# Patient Record
Sex: Male | Born: 1995 | Race: White | Hispanic: No | Marital: Single
Health system: Southern US, Community
[De-identification: ages and names within clinical notes are randomized; demographics above are authoritative.]

## PROBLEM LIST (undated history)

## (undated) DIAGNOSIS — L709 Acne, unspecified: Secondary | ICD-10-CM

## (undated) HISTORY — DX: Acne, unspecified: L70.9

---
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
## 2017-01-12 DIAGNOSIS — L700 Acne vulgaris: Secondary | ICD-10-CM | POA: Diagnosis not present

## 2017-01-12 DIAGNOSIS — L74512 Primary focal hyperhidrosis, palms: Secondary | ICD-10-CM | POA: Diagnosis not present

## 2017-01-12 DIAGNOSIS — L74510 Primary focal hyperhidrosis, axilla: Secondary | ICD-10-CM | POA: Diagnosis not present

## 2017-10-02 DIAGNOSIS — Z114 Encounter for screening for human immunodeficiency virus [HIV]: Secondary | ICD-10-CM | POA: Diagnosis not present

## 2017-10-02 DIAGNOSIS — Z113 Encounter for screening for infections with a predominantly sexual mode of transmission: Secondary | ICD-10-CM | POA: Diagnosis not present

## 2017-10-06 DIAGNOSIS — Z23 Encounter for immunization: Secondary | ICD-10-CM | POA: Diagnosis not present

## 2017-12-16 DIAGNOSIS — L74512 Primary focal hyperhidrosis, palms: Secondary | ICD-10-CM | POA: Diagnosis not present

## 2017-12-16 DIAGNOSIS — D225 Melanocytic nevi of trunk: Secondary | ICD-10-CM | POA: Diagnosis not present

## 2017-12-16 DIAGNOSIS — D485 Neoplasm of uncertain behavior of skin: Secondary | ICD-10-CM | POA: Diagnosis not present

## 2017-12-16 DIAGNOSIS — Z79899 Other long term (current) drug therapy: Secondary | ICD-10-CM | POA: Diagnosis not present

## 2017-12-16 DIAGNOSIS — L700 Acne vulgaris: Secondary | ICD-10-CM | POA: Diagnosis not present

## 2017-12-16 DIAGNOSIS — D229 Melanocytic nevi, unspecified: Secondary | ICD-10-CM | POA: Diagnosis not present

## 2017-12-16 DIAGNOSIS — L720 Epidermal cyst: Secondary | ICD-10-CM | POA: Diagnosis not present

## 2017-12-16 DIAGNOSIS — L905 Scar conditions and fibrosis of skin: Secondary | ICD-10-CM | POA: Diagnosis not present

## 2017-12-16 DIAGNOSIS — D2239 Melanocytic nevi of other parts of face: Secondary | ICD-10-CM | POA: Diagnosis not present

## 2017-12-16 DIAGNOSIS — L74510 Primary focal hyperhidrosis, axilla: Secondary | ICD-10-CM | POA: Diagnosis not present

## 2018-02-08 DIAGNOSIS — Z79899 Other long term (current) drug therapy: Secondary | ICD-10-CM | POA: Diagnosis not present

## 2018-02-08 DIAGNOSIS — L700 Acne vulgaris: Secondary | ICD-10-CM | POA: Diagnosis not present

## 2018-12-16 DIAGNOSIS — L74510 Primary focal hyperhidrosis, axilla: Secondary | ICD-10-CM | POA: Diagnosis not present

## 2018-12-16 DIAGNOSIS — L700 Acne vulgaris: Secondary | ICD-10-CM | POA: Diagnosis not present

## 2018-12-16 DIAGNOSIS — B078 Other viral warts: Secondary | ICD-10-CM | POA: Diagnosis not present

## 2019-03-18 DIAGNOSIS — L700 Acne vulgaris: Secondary | ICD-10-CM | POA: Diagnosis not present

## 2019-03-18 DIAGNOSIS — B078 Other viral warts: Secondary | ICD-10-CM | POA: Diagnosis not present

## 2020-03-19 ENCOUNTER — Other Ambulatory Visit: Payer: Self-pay

## 2020-03-19 ENCOUNTER — Ambulatory Visit (INDEPENDENT_AMBULATORY_CARE_PROVIDER_SITE_OTHER): Payer: 59 | Admitting: Dermatology

## 2020-03-19 VITALS — Wt 185.0 lb

## 2020-03-19 DIAGNOSIS — L700 Acne vulgaris: Secondary | ICD-10-CM

## 2020-03-19 MED ORDER — ISOTRETINOIN 40 MG PO CAPS: 40.0000 mg | ORAL_CAPSULE | Freq: Every day | ORAL | 0 refills | Status: AC

## 2020-03-19 NOTE — Progress Notes (Signed)
   Follow-Up Visit   Subjective  Frederick Anderson is a 24 y.o. male who presents for the following: Acne (wk 12 Isotretinoin, Isotretinoin 30mg  1 po qd).  No side effects from med other than mildly chapped lips.  No depression or mood changes.  No acne breakouts this month.  Virtual visit today.   The following portions of the chart were reviewed this encounter and updated as appropriate:     Review of Systems: No other skin or systemic complaints.  Objective  Well appearing patient in no apparent distress; mood and affect are within normal limits.  A focused examination was performed including face. Relevant physical exam findings are noted in the Assessment and Plan.   Objective  face: Face clear today  Assessment & Plan  Acne vulgaris face  Wk 12 Isotretinoin  Increase to Isotretinoin 40mg  1 po qd with fatty meal.  While taking isotretinoin, do not share pills and do not donate blood. Isotretinoin is best absorbed when taken with a fatty meal. Isotretinoin can make you sensitive to the sun. Daily careful sun protection including sunscreen SPF 30+ when outdoors is recommended.   Pt confirmed in [place] program.   ISOtretinoin (ACCUTANE) 40 MG capsule - face  Return in about 1 month (around 04/19/2020) for Isotretinoin.   I, Othelia Pulling, RMA, am acting as scribe for [name], MD .

## 2020-04-23 ENCOUNTER — Telehealth (INDEPENDENT_AMBULATORY_CARE_PROVIDER_SITE_OTHER): Payer: 59 | Admitting: Dermatology

## 2020-04-23 ENCOUNTER — Other Ambulatory Visit: Payer: Self-pay

## 2020-04-23 VITALS — Wt 185.0 lb

## 2020-04-23 DIAGNOSIS — L700 Acne vulgaris: Secondary | ICD-10-CM

## 2020-04-23 DIAGNOSIS — K130 Diseases of lips: Secondary | ICD-10-CM | POA: Diagnosis not present

## 2020-04-23 DIAGNOSIS — L853 Xerosis cutis: Secondary | ICD-10-CM

## 2020-04-23 MED ORDER — ISOTRETINOIN 40 MG PO CAPS: 40.0000 mg | ORAL_CAPSULE | Freq: Every day | ORAL | 0 refills | Status: AC

## 2020-04-23 NOTE — Progress Notes (Signed)
   Isotretinoin Follow-Up Visit   Subjective  Frederick Anderson is a 24 y.o. male who presents for the following: Telehealth Consent and Acne (wk #16 isotretinoin 40mg  1 po QD). No side effects from medicine other than mildly chapped lips.  No depression or mood changes.  No acne breakouts this month.  MyChart Video visit today.  He started higher dose of 40 mg about 10 days ago.  Subjective  Frederick Anderson is a 24 y.o. male who presents for the following: Telehealth Consent and Acne (wk #16 isotretinoin 40mg  1 po QD).  Week # 16  Isotretinoin F/U - 04/23/20 1700      Isotretinoin Follow Up   iPledge #  [id number]    Date  04/23/20    Weight  185 lb (83.9 kg)    Acne breakouts since last visit?  No      Dosage   Current (To Date) Dosage (mg)  40mg       Side Effects   Skin  Chapped Lips;Dry Skin    Gastrointestinal  WNL    Neurological  WNL    Constitutional  WNL       Side effects: Dry skin, dry lips  Denies changes in night vision, shortness of breath, abdominal pain, nausea, vomiting, diarrhea, blood in stool or urine, visual changes, headaches, epistaxis, joint pain, myalgias, mood changes, depression, or suicidal ideation.   The following portions of the chart were reviewed this encounter and updated as appropriate: medications, allergies, medical history  Review of Systems:  No other skin or systemic complaints except as noted in HPI or Assessment and Plan.  Objective  Well appearing patient in no apparent distress; mood and affect are within normal limits.  An examination of the face, neck, chest, and back was performed and relevant findings are noted below.   Objective  Face: Face clear today.   Assessment & Plan   Acne vulgaris Face  Improving Week #16 isotretinoin Video visit today  Continue isotretinoin 40mg  take 1 po QD with a fatty meal dsp #30 0Rf.  Patient confirmed in [place] program.  While taking isotretinoin, do not share pills and do not  donate blood. Isotretinoin is best absorbed when taken with a fatty meal. Isotretinoin can make you sensitive to the sun. Daily careful sun protection including sunscreen SPF 30+ when outdoors is recommended.   ISOtretinoin (ACCUTANE) 40 MG capsule - Face   Xerosis - Continue emollients as directed  Cheilitis - Continue lip balm as directed, Dr. Luvenia Heller Cortibalm recommended  Follow-up in 30 days.  IJamesetta Orleans, CMA, am acting as scribe for [name], MD .    Documentation: I have reviewed the above documentation for accuracy and completeness, and I agree with the above.  [name], MD

## 2020-06-05 ENCOUNTER — Other Ambulatory Visit: Payer: Self-pay

## 2020-06-05 ENCOUNTER — Telehealth (INDEPENDENT_AMBULATORY_CARE_PROVIDER_SITE_OTHER): Payer: 59 | Admitting: Dermatology

## 2020-06-05 ENCOUNTER — Encounter: Payer: Self-pay | Admitting: Dermatology

## 2020-06-05 VITALS — Wt 185.0 lb

## 2020-06-05 DIAGNOSIS — L700 Acne vulgaris: Secondary | ICD-10-CM | POA: Diagnosis not present

## 2020-06-05 DIAGNOSIS — Z79899 Other long term (current) drug therapy: Secondary | ICD-10-CM

## 2020-06-05 DIAGNOSIS — L853 Xerosis cutis: Secondary | ICD-10-CM | POA: Diagnosis not present

## 2020-06-05 DIAGNOSIS — K130 Diseases of lips: Secondary | ICD-10-CM

## 2020-06-05 MED ORDER — ISOTRETINOIN 40 MG PO CAPS: 40.0000 mg | ORAL_CAPSULE | Freq: Every day | ORAL | 0 refills | Status: AC

## 2020-06-05 NOTE — Progress Notes (Addendum)
CC Acne and isotretinoin follow-up  HPI isotretinoin  Subjective  Frederick Anderson is a 24 y.o. male who presents for the following: Acne.  Week # 20 40mg  1po QD  Isotretinoin F/U - 06/05/20 1700      Isotretinoin Follow Up   iPledge # [id number]    Date 06/05/20    Weight 185 lb (83.9 kg)    Acne breakouts since last visit? No      Dosage   Current (To Date) Dosage (mg) 40mg       Side Effects   Skin Dry Eyes;Chapped Lips;Dry Skin   Dry Eyes  new symptom   Gastrointestinal WNL    Neurological WNL    Constitutional WNL              Side effects: Dry skin, dry lips  Denies changes in night vision, shortness of breath, abdominal pain, nausea, vomiting, diarrhea, blood in stool or urine, visual changes, headaches, epistaxis, joint pain, myalgias, mood changes, depression, or suicidal ideation.   Objective Exam of face, chest and back performed and relevant findings noted in Assessment and Plan  A/P:  Acne vulgaris -  Week #20 isotretinoin - Exam shows Face Clear, improving - Continue Isotretinoin at 40 mg daily - Labs none  While taking Isotretinoin and for 30 days after you finish the medication,do not share pills, do not donate blood. Isotretinoin is best absorbed when taken with a fatty meal. Isotretinoin can make you sensitive to the sun. Daily careful sun protection including sunscreen SPF 30+ when outdoors is recommended.  Xerosis - Continue emollients as directed  Cheilitis - Continue lip balm as directed, Dr. Luvenia Heller Cortibalm recommended  Follow-up in 30 days  Virtual visit today Patient located in [place] located in [place] Patient voices understanding their insurance will be billed just like for a regular in office visit  Patient confirmed in iPledge and isotretinoin sent to pharmacy.   Visit was done by video- Virtual visit duration 12 minutes  I, Donzetta Kohut, CMA, am acting as scribe for [name], MD  .  Documentation: I have reviewed the above documentation for accuracy and completeness, and I agree with the above.  [name] MD

## 2020-09-16 ENCOUNTER — Telehealth (INDEPENDENT_AMBULATORY_CARE_PROVIDER_SITE_OTHER): Payer: Self-pay | Admitting: Dermatology

## 2020-09-16 ENCOUNTER — Other Ambulatory Visit: Payer: Self-pay

## 2020-09-16 VITALS — Wt 185.0 lb

## 2020-09-16 DIAGNOSIS — L700 Acne vulgaris: Secondary | ICD-10-CM

## 2020-09-16 DIAGNOSIS — Z79899 Other long term (current) drug therapy: Secondary | ICD-10-CM

## 2020-09-16 DIAGNOSIS — L853 Xerosis cutis: Secondary | ICD-10-CM

## 2020-09-16 DIAGNOSIS — K130 Diseases of lips: Secondary | ICD-10-CM

## 2020-09-16 MED ORDER — ISOTRETINOIN 40 MG PO CAPS: 40.0000 mg | ORAL_CAPSULE | Freq: Every day | ORAL | 0 refills | Status: AC

## 2020-09-16 NOTE — Progress Notes (Signed)
Isotretinoin Follow-Up Visit   Subjective  Frederick Anderson is a 24 y.o. male who presents for the following: Acne (Wk #24). Patient ran out of medicine 2 weeks ago. He was taking isotretinoin 40mg  qd with only dry lips. No other side effects.  Week # 24 [place]   Isotretinoin F/U - 09/16/20 1700      Isotretinoin Follow Up   iPledge # [id number]    Date 09/16/20    Weight 185 lb (83.9 kg)    Acne breakouts since last visit? Yes      Dosage   Current (To Date) Dosage (mg) 40mg       Side Effects   Skin Chapped Lips    Gastrointestinal WNL    Neurological WNL    Constitutional WNL             Side effects: Dry skin, dry lips  Denies changes in night vision, shortness of breath, abdominal pain, nausea, vomiting, diarrhea, blood in stool or urine, visual changes, headaches, epistaxis, joint pain, myalgias, mood changes, depression, or suicidal ideation.   The following portions of the chart were reviewed this encounter and updated as appropriate: medications, allergies, medical history  Review of Systems:  No other skin or systemic complaints except as noted in HPI or Assessment and Plan.  Objective  Well appearing patient in no apparent distress; mood and affect are within normal limits.  An examination of the face, neck, chest, and back was performed and relevant findings are noted below.   Objective  Face: Clear today.   Assessment & Plan   Acne vulgaris Face  Wk #24 [place]  Improved.   Continue isotretinoin 40mg  1 po qd with a fatty meal dsp #30 0Rf.  Patient confirmed in iPledge and isotretinoin sent to pharmacy.    Xerosis - Continue emollients as directed  Cheilitis - Continue lip balm as directed, Dr. Luvenia Heller Cortibalm recommended  Long term medication management (isotretinoin) - While taking Isotretinoin and for 30 days after you finish the medication, do not share pills, do not donate blood. Isotretinoin is best absorbed  when taken with a fatty meal. Isotretinoin can make you sensitive to the sun. Daily careful sun protection including sunscreen SPF 30+ when outdoors is recommended.  Follow-up in 30 days.  Virtual Visit via Video Note  I connected with Frederick Anderson on 09/16/20 at  4:00 PM EDT by a video enabled telemedicine application and verified that I am speaking with the correct person using two identifiers.  Location: Patient: Frederick Anderson Provider:[facility] [place]   I discussed the limitations of evaluation and management by telemedicine and the availability of in person appointments. The patient expressed understanding and agreed to proceed.  I discussed the assessment and treatment plan with the patient. The patient was provided an opportunity to ask questions and all were answered. The patient agreed with the plan and demonstrated an understanding of the instructions.   The patient was advised to call back or seek an in-person evaluation if the symptoms worsen or if the condition fails to improve as anticipated.  Virtual visit today Patient located in [place] located in [place] Patient voices understanding their insurance will be billed just like for a regular in office visit  MyChart Video Visit today - total 13 mins.  [name], CMA, am acting as scribe for [name], MD .   Documentation: I have reviewed the above documentation for accuracy and completeness, and I agree with the  above.  [name] MD

## 2021-01-28 ENCOUNTER — Encounter: Payer: Managed Care, Other (non HMO) | Admitting: Dermatology

## 2021-01-28 ENCOUNTER — Other Ambulatory Visit: Payer: Self-pay

## 2021-01-28 NOTE — Progress Notes (Signed)
e  Follow-Up Visit     .virt  The following portions of the chart were reviewed this encounter and updated as appropriate:       Review of Systems:  No other skin or systemic complaints except as noted in HPI or Assessment and Plan.  Objective  Well appearing patient in no apparent distress; mood and affect are within normal limits.      Assessment & Plan   No follow-ups on file. This encounter was created in error - please disregard.

## 2021-01-29 ENCOUNTER — Telehealth (INDEPENDENT_AMBULATORY_CARE_PROVIDER_SITE_OTHER): Payer: Managed Care, Other (non HMO) | Admitting: Dermatology

## 2021-01-29 DIAGNOSIS — K130 Diseases of lips: Secondary | ICD-10-CM

## 2021-01-29 DIAGNOSIS — L853 Xerosis cutis: Secondary | ICD-10-CM

## 2021-01-29 DIAGNOSIS — L74519 Primary focal hyperhidrosis, unspecified: Secondary | ICD-10-CM

## 2021-01-29 DIAGNOSIS — L74510 Primary focal hyperhidrosis, axilla: Secondary | ICD-10-CM | POA: Diagnosis not present

## 2021-01-29 DIAGNOSIS — Z79899 Other long term (current) drug therapy: Secondary | ICD-10-CM | POA: Diagnosis not present

## 2021-01-29 DIAGNOSIS — L700 Acne vulgaris: Secondary | ICD-10-CM

## 2021-01-29 MED ORDER — QBREXZA 2.4 % EX PADS: MEDICATED_PAD | CUTANEOUS | 5 refills | Status: DC

## 2021-01-29 MED ORDER — ISOTRETINOIN 40 MG PO CAPS: 40.0000 mg | ORAL_CAPSULE | Freq: Every day | ORAL | 0 refills | Status: DC

## 2021-01-29 NOTE — Progress Notes (Signed)
Video Visit   Subjective  Frederick Anderson is a 25 y.o. male who presents for the following: Acne (Patient following up on acne s/p isotretinoin. He finished isotretinoin about 1 week ago.) and Excessive Sweating (Patient complains of sweating of the hands, axilla, and feet for years. He has used topicals in the past but unsure of the names. He has also has Botox injections in the past. He is currently using topical Carpe on the hands with minimal improvement.). Pt is on low dose isotretinoin since unable to tolerate s.e. at higher dosing.   Isotretinoin F/U - 01/29/21 1500      Isotretinoin Follow Up   iPledge # [id number]    Date 01/29/21    Acne breakouts since last visit? No      Dosage   Current (To Date) Dosage (mg) 40 MG      Side Effects   Skin WNL    Gastrointestinal WNL    Neurological WNL    Constitutional WNL            The following portions of the chart were reviewed this encounter and updated as appropriate:       Review of Systems:  No other skin or systemic complaints except as noted in HPI or Assessment and Plan.  Objective  Well appearing patient in no apparent distress; mood and affect are within normal limits.  A focused examination was performed including face. Relevant physical exam findings are noted in the Assessment and Plan.  Objective  face: Face clear today.  Objective  hands, feet, axilla: Excessive sweating per patient.   Assessment & Plan  Acne vulgaris face  Wk #28. Improving.  Continue Absorica 40mg  take 1 po QD dsp #30 0Rf  Patient confirmed in [facility] program and medicine sent in.     ISOtretinoin (ABSORICA) 40 MG capsule - face  Primary focal hyperhidrosis hands, feet, axilla  Start Qbrexza Swipe cloth under each arm once daily, then rub cloth on hands x 3 mins until cloth is dry. Apply cotton gloves and leave on for 30 mins, then wash thoroughly. Dsp #30 5Rf.  May try on feet. Avoid touching eye  area.   Glycopyrronium Tosylate (QBREXZA) 2.4 % PADS - hands, feet, axilla  Xerosis - Continue emollients as directed  Cheilitis - Continue lip balm as directed, Dr. Luvenia Heller Cortibalm recommended  Long term medication management (isotretinoin) - While taking Isotretinoin and for 30 days after you finish the medication, do not share pills, do not donate blood. Isotretinoin is best absorbed when taken with a fatty meal. Isotretinoin can make you sensitive to the sun. Daily careful sun protection including sunscreen SPF 30+ when outdoors is recommended.  Virtual Visit via Video Note   I connected with Frederick Anderson on January 29, 2021 at  1:20PM  EDT by a video enabled telemedicine application and verified that I am speaking with the correct person using two identifiers.   Location: Patient: [place], [place]  Provider: [place], [place] discussed the limitations of evaluation and management by telemedicine and the availability of in person appointments. The patient expressed understanding and agreed to proceed.   I discussed the assessment and treatment plan with the patient. The patient was provided an opportunity to ask questions and all were answered. The patient agreed with the plan and demonstrated an understanding of the instructions.   The patient was advised to call back or seek an in-person evaluation if the symptoms worsen or if the  condition fails to improve as anticipated.   Patient located in [place], [place] at time of visit.  Patient is aware that insurance will be billed for this appointment.  [place] is located in [place], [place]  Length of Video Visit: 11 minutes  Return in about 30 days (around 02/28/2021) for Absorica f/u, Hyperhidrosis.   IJamesetta Anderson, CMA, am acting as scribe for Frederick Patty, MD .  Documentation: I have reviewed the above documentation for accuracy and completeness, and I agree with the above.  Frederick Patty MD

## 2021-03-19 ENCOUNTER — Ambulatory Visit (INDEPENDENT_AMBULATORY_CARE_PROVIDER_SITE_OTHER): Payer: Managed Care, Other (non HMO) | Admitting: Dermatology

## 2021-03-19 ENCOUNTER — Other Ambulatory Visit: Payer: Self-pay

## 2021-03-19 ENCOUNTER — Encounter: Payer: Self-pay | Admitting: Dermatology

## 2021-03-19 VITALS — Wt 195.0 lb

## 2021-03-19 DIAGNOSIS — D225 Melanocytic nevi of trunk: Secondary | ICD-10-CM | POA: Diagnosis not present

## 2021-03-19 DIAGNOSIS — L700 Acne vulgaris: Secondary | ICD-10-CM

## 2021-03-19 DIAGNOSIS — D2239 Melanocytic nevi of other parts of face: Secondary | ICD-10-CM

## 2021-03-19 DIAGNOSIS — D224 Melanocytic nevi of scalp and neck: Secondary | ICD-10-CM | POA: Diagnosis not present

## 2021-03-19 DIAGNOSIS — D492 Neoplasm of unspecified behavior of bone, soft tissue, and skin: Secondary | ICD-10-CM

## 2021-03-19 MED ORDER — ISOTRETINOIN 40 MG PO CAPS: 40.0000 mg | ORAL_CAPSULE | Freq: Every day | ORAL | 0 refills | Status: DC

## 2021-03-19 NOTE — Progress Notes (Signed)
Follow-Up Visit   Subjective  Frederick Anderson is a 25 y.o. male who presents for the following: Nevus (Left side of neck. Dur: >1 year. Dark in color. ). Acne. Accutane therapy. End of 32nd week. Tolerating well. Taking 40 mg daily.  Has other irritated moles to remove.   Isotretinoin F/U - 03/19/21 1300      Isotretinoin Follow Up   iPledge # [id number]    Date 03/19/21    Weight 195 lb (88.5 kg)    Acne breakouts since last visit? Yes      Dosage   Target Dosage (mg) 17, 700 mg    Current (To Date) Dosage (mg) 8700 mg    To Go Dosage (mg) 9,000 mg      Side Effects   Skin WNL;Chapped Lips;Dry Lips    Gastrointestinal WNL    Neurological WNL    Constitutional WNL            The following portions of the chart were reviewed this encounter and updated as appropriate:      Review of Systems: No other skin or systemic complaints except as noted in HPI or Assessment and Plan.  Objective  Well appearing patient in no apparent distress; mood and affect are within normal limits.  All skin waist up examined.  Objective  Left lateral Neck: 0.2cm dark brown macule     Objective  Left proximal angle of  Mandible: 0.2cm brown papule  Objective  Right superior medial scapula: 0.3cm brown papule     Objective  face: Face clear   Assessment & Plan  Neoplasm of skin (3) Left lateral Neck  Epidermal / dermal shaving  Lesion diameter (cm):  0.4 Informed consent: discussed and consent obtained   Patient was prepped and draped in usual sterile fashion: Area prepped with alcohol. Anesthesia: the lesion was anesthetized in a standard fashion   Anesthetic:  1% lidocaine w/ epinephrine 1-100,000 buffered w/ 8.4% NaHCO3 Instrument used: flexible razor blade   Hemostasis achieved with: pressure, aluminum chloride and electrodesiccation   Outcome: patient tolerated procedure well   Post-procedure details: wound care instructions given   Post-procedure details  comment:  Ointment and small bandage applied  Specimen 1 - Surgical pathology Differential Diagnosis: blue nevus vs other  Check Margins: No 0.2cm dark brown macule  Left proximal angle of  Mandible  Epidermal / dermal shaving  Lesion diameter (cm):  0.4 Informed consent: discussed and consent obtained   Patient was prepped and draped in usual sterile fashion: Area prepped with alcohol. Anesthesia: the lesion was anesthetized in a standard fashion   Anesthetic:  1% lidocaine w/ epinephrine 1-100,000 buffered w/ 8.4% NaHCO3 Instrument used: flexible razor blade   Hemostasis achieved with: pressure, aluminum chloride and electrodesiccation   Outcome: patient tolerated procedure well   Post-procedure details: wound care instructions given   Post-procedure details comment:  Ointment and small bandage applied  Specimen 2 - Surgical pathology Differential Diagnosis: irritated nevus vs other  Check Margins: No 0.2cm brown papule  Right superior medial scapula  Epidermal / dermal shaving  Lesion diameter (cm):  0.5 Informed consent: discussed and consent obtained   Patient was prepped and draped in usual sterile fashion: Area prepped with alcohol. Anesthesia: the lesion was anesthetized in a standard fashion   Anesthetic:  1% lidocaine w/ epinephrine 1-100,000 buffered w/ 8.4% NaHCO3 Instrument used: flexible razor blade   Hemostasis achieved with: pressure, aluminum chloride and electrodesiccation   Outcome: patient tolerated procedure  well   Post-procedure details: wound care instructions given   Post-procedure details comment:  Ointment and small bandage applied  Specimen 3 - Surgical pathology Differential Diagnosis:irritated nevus vs other  Check Margins: No 0.3cm brown papule  Acne vulgaris face  End of week 32 isotretinoin Current dosage 8700mg . 98mg /kg  Continue Absorica 40mg  1 po qd with food.  Acne is chronic, severe, not at goal.  While taking  isotretinoin, do not share pills and do not donate blood. Isotretinoin is best absorbed when taken with a fatty meal. Isotretinoin can make you sensitive to the sun. Daily careful sun protection including sunscreen SPF 30+ when outdoors is recommended.   Patient confirmed in iPledge and isotretinoin sent to pharmacy.   Reordered Medications ISOtretinoin (ABSORICA) 40 MG capsule  Return if symptoms worsen or fail to improve, for Patient will call to schedule appointment.   I, Emelia Salisbury, CMA, am acting as scribe for [name], MD.  Documentation: I have reviewed the above documentation for accuracy and completeness, and I agree with the above.  [name] MD

## 2021-03-19 NOTE — Patient Instructions (Addendum)
Wound Care Instructions  1. Cleanse wound gently with soap and water once a day then pat dry with clean gauze. Apply a thing coat of Petrolatum (petroleum jelly, "Vaseline") over the wound (unless you have an allergy to this). We recommend that you use a new, sterile tube of Vaseline. Do not pick or remove scabs. Do not remove the yellow or white "healing tissue" from the base of the wound.  2. Cover the wound with fresh, clean, nonstick gauze and secure with paper tape. You may use Band-Aids in place of gauze and tape if the would is small enough, but would recommend trimming much of the tape off as there is often too much. Sometimes Band-Aids can irritate the skin.  3. You should call the office for your biopsy report after 1 week if you have not already been contacted.  4. If you experience any problems, such as abnormal amounts of bleeding, swelling, significant bruising, significant pain, or evidence of infection, please call the office immediately.  5. FOR ADULT SURGERY PATIENTS: If you need something for pain relief you may take 1 extra strength Tylenol (acetaminophen) AND 2 Ibuprofen (200mg  each) together every 4 hours as needed for pain. (do not take these if you are allergic to them or if you have a reason you should not take them.) Typically, you may only need pain medication for 1 to 3 days.    While taking isotretinoin, do not share pills and do not donate blood. Isotretinoin is best absorbed when taken with a fatty meal. Isotretinoin can make you sensitive to the sun. Daily careful sun protection including sunscreen SPF 30+ when outdoors is recommended.  If you have any questions or concerns for your doctor, please call our main line at [phone] and press option 4 to reach your doctor's medical assistant. If no one answers, please leave a voicemail as directed and we will return your call as soon as possible. Messages left after 4 pm will be answered the following business day.    You may also send [place] a message via [place]. We typically respond to MyChart messages within 1-2 business days.  For prescription refills, please ask your pharmacy to contact our office. Our fax number is [phone].  If you have an urgent issue when the clinic is closed that cannot wait until the next business day, you can page your doctor at the number below.    Please note that while we do our best to be available for urgent issues outside of office hours, we are not available 24/7.   If you have an urgent issue and are unable to reach [place], you may choose to seek medical care at your doctor's office, retail clinic, urgent care center, or emergency room.  If you have a medical emergency, please immediately call 911 or go to the emergency department.  Pager Numbers  - Dr. Nehemiah Massed: [phone]  - Dr. Laurence Ferrari: [phone]  - Dr. Nicole Kindred: [phone]  In the event of inclement weather, please call our main line at [phone] for an update on the status of any delays or closures.  Dermatology Medication Tips: Please keep the boxes that topical medications come in in order to help keep track of the instructions about where and how to use these. Pharmacies typically print the medication instructions only on the boxes and not directly on the medication tubes.   If your medication is too expensive, please contact our office at [phone] option 4 or send [place] a message through [place].  We are unable to tell what your co-pay for medications will be in advance as this is different depending on your insurance coverage. However, we may be able to find a substitute medication at lower cost or fill out paperwork to get insurance to cover a needed medication.   If a prior authorization is required to get your medication covered by your insurance company, please allow [place] 1-2 business days to complete this process.  Drug prices often vary depending on where the prescription is filled and some  pharmacies may offer cheaper prices.  The website www.goodrx.com contains coupons for medications through different pharmacies. The prices here do not account for what the cost may be with help from insurance (it may be cheaper with your insurance), but the website can give you the price if you did not use any insurance.  - You can print the associated coupon and take it with your prescription to the pharmacy.  - You may also stop by our office during regular business hours and pick up a GoodRx coupon card.  - If you need your prescription sent electronically to a different pharmacy, notify our office through [facility] or by phone at [phone] option 4.

## 2021-03-24 ENCOUNTER — Telehealth: Payer: Self-pay

## 2021-03-24 NOTE — Telephone Encounter (Signed)
Left pt msg to call for bx results/sh 

## 2021-03-24 NOTE — Telephone Encounter (Signed)
-----   Message from [name], MD sent at 03/24/2021  9:00 AM EDT ----- 1. Skin , left laterak neck BLUE NEVUS, CLOSE TO MARGIN 2. Skin , left proximal angle of mandible MELANOCYTIC NEVUS, COMPOUND TYPE, IRRITATED, BASE INVOLVED 3. Skin , right upper superior medial scapula MELANOCYTIC NEVUS, INTRADERMAL TYPE, BASE INVOLVED  1. Benign mole (blue nevus) 2. Benign irritated mole 3. Benign mole

## 2021-04-01 ENCOUNTER — Telehealth: Payer: Self-pay

## 2021-04-01 NOTE — Telephone Encounter (Signed)
-----   Message from [name], MD sent at 03/24/2021  9:00 AM EDT ----- 1. Skin , left laterak neck BLUE NEVUS, CLOSE TO MARGIN 2. Skin , left proximal angle of mandible MELANOCYTIC NEVUS, COMPOUND TYPE, IRRITATED, BASE INVOLVED 3. Skin , right upper superior medial scapula MELANOCYTIC NEVUS, INTRADERMAL TYPE, BASE INVOLVED  1. Benign mole (blue nevus) 2. Benign irritated mole 3. Benign mole

## 2021-04-01 NOTE — Telephone Encounter (Signed)
Advised pt of bx results/sh ?

## 2021-12-16 ENCOUNTER — Ambulatory Visit (INDEPENDENT_AMBULATORY_CARE_PROVIDER_SITE_OTHER): Payer: BC Managed Care – PPO | Admitting: Dermatology

## 2021-12-16 ENCOUNTER — Other Ambulatory Visit: Payer: Self-pay

## 2021-12-16 DIAGNOSIS — L649 Androgenic alopecia, unspecified: Secondary | ICD-10-CM | POA: Diagnosis not present

## 2021-12-16 DIAGNOSIS — L74519 Primary focal hyperhidrosis, unspecified: Secondary | ICD-10-CM

## 2021-12-16 DIAGNOSIS — L700 Acne vulgaris: Secondary | ICD-10-CM

## 2021-12-16 DIAGNOSIS — L74510 Primary focal hyperhidrosis, axilla: Secondary | ICD-10-CM

## 2021-12-16 MED ORDER — ISOTRETINOIN 20 MG PO CAPS: 20.0000 mg | ORAL_CAPSULE | Freq: Two times a day (BID) | ORAL | 0 refills | Status: DC

## 2021-12-16 MED ORDER — GLYCOPYRROLATE 1 MG PO TABS: 1.0000 mg | ORAL_TABLET | Freq: Two times a day (BID) | ORAL | 1 refills | Status: DC

## 2021-12-16 MED ORDER — FINASTERIDE 1 MG PO TABS: 1.0000 mg | ORAL_TABLET | Freq: Every day | ORAL | 1 refills | Status: DC

## 2021-12-16 MED ORDER — MINOXIDIL 2.5 MG PO TABS: 2.5000 mg | ORAL_TABLET | Freq: Two times a day (BID) | ORAL | 1 refills | Status: DC

## 2021-12-16 NOTE — Patient Instructions (Addendum)
Start minoxidil 2.5 mg once daily and if tolerating, may increase to twice daily.  Continue finasteride 1 mg once daily.  Continue glycopyrrolate 1 mg twice daily as needed for sweating. Avoid taking prior to exercise or when out in the heat.   Restart isotretinoin 20 mg twice daily    If You Need Anything After Your Visit  If you have any questions or concerns for your doctor, please call our main line at [phone] and press option 4 to reach your doctor's medical assistant. If no one answers, please leave a voicemail as directed and we will return your call as soon as possible. Messages left after 4 pm will be answered the following business day.   You may also send [place] a message via [place]. We typically respond to MyChart messages within 1-2 business days.  For prescription refills, please ask your pharmacy to contact our office. Our fax number is [phone].  If you have an urgent issue when the clinic is closed that cannot wait until the next business day, you can page your doctor at the number below.    Please note that while we do our best to be available for urgent issues outside of office hours, we are not available 24/7.   If you have an urgent issue and are unable to reach [place], you may choose to seek medical care at your doctor's office, retail clinic, urgent care center, or emergency room.  If you have a medical emergency, please immediately call 911 or go to the emergency department.  Pager Numbers  - Dr. Nehemiah Massed: [phone]  - Dr. Laurence Ferrari: [phone]  - Dr. Nicole Kindred: [phone]  In the event of inclement weather, please call our main line at [phone] for an update on the status of any delays or closures.  Dermatology Medication Tips: Please keep the boxes that topical medications come in in order to help keep track of the instructions about where and how to use these. Pharmacies typically print the medication instructions only on the boxes and not directly  on the medication tubes.   If your medication is too expensive, please contact our office at [phone] option 4 or send [place] a message through [place].   We are unable to tell what your co-pay for medications will be in advance as this is different depending on your insurance coverage. However, we may be able to find a substitute medication at lower cost or fill out paperwork to get insurance to cover a needed medication.   If a prior authorization is required to get your medication covered by your insurance company, please allow [place] 1-2 business days to complete this process.  Drug prices often vary depending on where the prescription is filled and some pharmacies may offer cheaper prices.  The website www.goodrx.com contains coupons for medications through different pharmacies. The prices here do not account for what the cost may be with help from insurance (it may be cheaper with your insurance), but the website can give you the price if you did not use any insurance.  - You can print the associated coupon and take it with your prescription to the pharmacy.  - You may also stop by our office during regular business hours and pick up a GoodRx coupon card.  - If you need your prescription sent electronically to a different pharmacy, notify our office through [facility] or by phone at [phone] option 4.     Si Usted Necesita Algo Despus de Su Visita  Tambin  puede enviarnos un mensaje a travs de MyChart. Por lo general respondemos a los mensajes de MyChart en el transcurso de 1 a 2 das hbiles.  Para renovar recetas, por favor pida a su farmacia que se ponga en contacto con nuestra oficina. Harland Dingwall de fax es [place] [phone].  Si tiene un asunto urgente cuando la clnica est cerrada y que no puede esperar hasta el siguiente da hbil, puede llamar/localizar a su doctor(a) al nmero que aparece a continuacin.   Por favor, tenga en cuenta que aunque hacemos todo lo  posible para estar disponibles para asuntos urgentes fuera del horario de [place], no estamos disponibles las 24 horas del da, los 7 das de la [place].   Si tiene un problema urgente y no puede comunicarse con nosotros, puede optar por buscar atencin mdica  en el consultorio de su doctor(a), en una clnica privada, en un centro de atencin urgente o en una sala de emergencias.  Si tiene Engineering geologist, por favor llame inmediatamente al 911 o vaya a la sala de emergencias.  Nmeros de bper  - Dr. Nehemiah Massed: [phone]  - Dra. Moye: [phone]  - Dra. Nicole Kindred: [phone]  En caso de inclemencias del [place], por favor llame a Johnsie Kindred principal al [phone] para una actualizacin sobre el [place] de cualquier retraso o cierre.  Consejos para la medicacin en dermatologa: Por favor, guarde las cajas en las que vienen los medicamentos de uso tpico para ayudarle a seguir las instrucciones sobre dnde y cmo usarlos. Las farmacias generalmente imprimen las instrucciones del medicamento slo en las cajas y no directamente en los tubos del [place].   Si su medicamento es muy caro, por favor, pngase en contacto con Zigmund Daniel llamando al [phone] y presione la opcin 4 o envenos un mensaje a travs de Pharmacist, community.   No podemos decirle cul ser su copago por los medicamentos por adelantado ya que esto es diferente dependiendo de la cobertura de su seguro. Sin embargo, es posible que podamos encontrar un medicamento sustituto a Electrical engineer un formulario para que el seguro cubra el medicamento que se considera necesario.   Si se requiere una autorizacin previa para que su compaa de seguros [place] su medicamento, por favor permtanos de 1 a 2 das hbiles para completar este proceso.  Los precios de los medicamentos varan con frecuencia dependiendo del Environmental consultant de dnde se surte la receta y alguna farmacias pueden ofrecer precios ms baratos.  El sitio web  www.goodrx.com tiene cupones para medicamentos de Airline pilot. Los precios aqu no tienen en cuenta lo que podra costar con la ayuda del seguro (puede ser ms barato con su seguro), pero el sitio web puede darle el precio si no utiliz Research scientist (physical sciences).  - Puede imprimir el cupn correspondiente y llevarlo con su receta a la farmacia.  - Tambin puede pasar por nuestra oficina durante el horario de atencin regular y Charity fundraiser una tarjeta de cupones de GoodRx.  - Si necesita que su receta se enve electrnicamente a una farmacia diferente, informe a nuestra oficina a travs de MyChart de [place] o por telfono llamando al [phone] y presione la opcin 4.

## 2021-12-16 NOTE — Progress Notes (Signed)
Follow-Up Visit   Subjective  Frederick Anderson is a 25 y.o. male who presents for the following: Excessive Sweating (Patient currently on 1 mg glycopyrrolate and is pleased with how it is working. ) and Acne (Patient would like to talk about accutane. He has been off of medication for about 2 weeks and has been taking sporadically. He would like to continue on medication. ).  He has had a few breakouts since stopping.  He sometimes needs to increase the glycopyrrolate to twice daily.  Patient has been on finasteride but would like to talk about taking oral minoxidil. Patient has used topical minoxidil, finasteride 1 mg (needs rfs)   The following portions of the chart were reviewed this encounter and updated as appropriate:       Review of Systems:  No other skin or systemic complaints except as noted in HPI or Assessment and Plan.  Objective  Well appearing patient in no apparent distress; mood and affect are within normal limits.  A focused examination was performed including face, scalp. Relevant physical exam findings are noted in the Assessment and Plan.  bilateral hands, axilla Excessive sweating   Scalp Mild bitemporal recession and thinning on crown scalp  face Few closed comedones at cheeks    Assessment & Plan  Primary focal hyperhidrosis bilateral hands, axilla  Continue glycopyrrolate 1 mg twice daily as needed for sweating. Avoid taking prior to exercise or when out in the heat.   Glycopyrrolate can significantly increase the risk of heat stroke so you should avoid using it in the heat, particularly while active. It can also cause dry mouth, blurred vision, difficulty with urination, headache, constipation, and racing heart. Take it only as directed. Never take more than 8 tablets total per day.   glycopyrrolate (ROBINUL) 1 MG tablet - bilateral hands, axilla Take 1 tablet (1 mg total) by mouth 2 (two) times daily.  Androgenic alopecia Scalp  Start  minoxidil 2.5 mg once daily and if tolerating, may increase to twice daily. S.e. dizziness, lightheadedness, low blood pressure Continue finasteride 1 mg once daily, tolerating well, no side effects   BP 129/76   minoxidil (LONITEN) 2.5 MG tablet - Scalp Take 1 tablet (2.5 mg total) by mouth 2 (two) times daily.  finasteride (PROPECIA) 1 MG tablet - Scalp Take 1 tablet (1 mg total) by mouth daily.  Acne vulgaris face  Restart isotretinoin 20 mg twice daily, reviewed potential side effects.  Pt will need to continue isotretinoin treatment with another physician in [place].   Patient reregistered in [place] program.  Patient confirmed in [place] and isotretinoin sent to pharmacy.  While taking isotretinoin, do not share pills and do not donate blood. Generic isotretinoin is best absorbed when taken with a fatty meal. Isotretinoin can make you sensitive to the sun. Daily careful sun protection including sunscreen SPF 30+ when outdoors is recommended.   Reviewed potential side effects of isotretinoin including xerosis, cheilitis, hepatitis, hyperlipidemia, and severe birth defects if taken by a pregnant woman. Reviewed reports of suicidal ideation in those with a history of depression while taking isotretinoin and reports of diagnosis of inflammatory bowl disease while taking isotretinoin as well as the lack of evidence for a causal relationship between isotretinoin, depression and IBD. Patient advised to reach out with any questions or concerns. Patient advised not to share pills or donate blood while on treatment or for one month after completing treatment.    ISOtretinoin (ACCUTANE) 20 MG capsule - face Take  1 capsule (20 mg total) by mouth 2 (two) times daily.   Return in about 6 months (around 06/16/2022).  Graciella Belton, RMA, am acting as scribe for [name], MD .  Documentation: I have reviewed the above documentation for accuracy and completeness, and I agree with the  above.  [name] MD

## 2021-12-16 NOTE — Progress Notes (Signed)
Accutane resent with patient's ipledge ID for pharmacy.

## 2021-12-23 ENCOUNTER — Encounter: Payer: Self-pay | Admitting: Dermatology

## 2021-12-23 ENCOUNTER — Other Ambulatory Visit: Payer: Self-pay

## 2021-12-23 DIAGNOSIS — L700 Acne vulgaris: Secondary | ICD-10-CM

## 2021-12-23 MED ORDER — ISOTRETINOIN 20 MG PO CAPS: 20.0000 mg | ORAL_CAPSULE | Freq: Two times a day (BID) | ORAL | 0 refills | Status: AC

## 2022-03-23 ENCOUNTER — Other Ambulatory Visit: Payer: Self-pay

## 2022-03-23 DIAGNOSIS — L649 Androgenic alopecia, unspecified: Secondary | ICD-10-CM

## 2022-03-23 MED ORDER — FINASTERIDE 1 MG PO TABS: 1.0000 mg | ORAL_TABLET | Freq: Every day | ORAL | 0 refills | Status: DC

## 2022-03-23 NOTE — Progress Notes (Signed)
RF request from [facility]. aw ?

## 2022-06-16 ENCOUNTER — Other Ambulatory Visit: Payer: Self-pay | Admitting: Dermatology

## 2022-06-28 ENCOUNTER — Other Ambulatory Visit: Payer: Self-pay | Admitting: Dermatology

## 2022-06-28 DIAGNOSIS — L649 Androgenic alopecia, unspecified: Secondary | ICD-10-CM

## 2022-10-15 ENCOUNTER — Other Ambulatory Visit: Payer: Self-pay | Admitting: Dermatology

## 2022-10-15 DIAGNOSIS — L649 Androgenic alopecia, unspecified: Secondary | ICD-10-CM

## 2023-02-03 ENCOUNTER — Other Ambulatory Visit: Payer: Self-pay | Admitting: Dermatology

## 2023-02-03 DIAGNOSIS — L649 Androgenic alopecia, unspecified: Secondary | ICD-10-CM

## 2023-02-16 ENCOUNTER — Other Ambulatory Visit: Payer: Self-pay | Admitting: Dermatology

## 2023-02-16 DIAGNOSIS — L649 Androgenic alopecia, unspecified: Secondary | ICD-10-CM

## 2023-03-04 ENCOUNTER — Other Ambulatory Visit: Payer: Self-pay

## 2023-03-04 DIAGNOSIS — L219 Seborrheic dermatitis, unspecified: Secondary | ICD-10-CM

## 2023-03-04 MED ORDER — KETOCONAZOLE 2 % EX SHAM: 1.0000 | MEDICATED_SHAMPOO | Freq: Three times a day (TID) | CUTANEOUS | 4 refills | Status: AC

## 2023-03-04 MED ORDER — KETOCONAZOLE 2 % EX SHAM: 1.0000 | MEDICATED_SHAMPOO | Freq: Three times a day (TID) | CUTANEOUS | 3 refills | Status: DC

## 2023-03-04 NOTE — Progress Notes (Signed)
-   Ketoconazole shampoo

## 2023-04-16 ENCOUNTER — Other Ambulatory Visit: Payer: Self-pay | Admitting: Dermatology

## 2023-05-03 ENCOUNTER — Other Ambulatory Visit: Payer: Self-pay | Admitting: Dermatology

## 2023-09-16 ENCOUNTER — Other Ambulatory Visit: Payer: Self-pay | Admitting: Dermatology

## 2023-09-16 DIAGNOSIS — L649 Androgenic alopecia, unspecified: Secondary | ICD-10-CM

## 2023-12-28 ENCOUNTER — Other Ambulatory Visit: Payer: Self-pay

## 2023-12-28 DIAGNOSIS — L649 Androgenic alopecia, unspecified: Secondary | ICD-10-CM

## 2023-12-28 MED ORDER — MINOXIDIL 2.5 MG PO TABS
5.0000 mg | ORAL_TABLET | Freq: Every day | ORAL | 2 refills | Status: AC
  Filled 2023-12-28: qty 180, 90d supply, fill #0

## 2024-11-16 ENCOUNTER — Other Ambulatory Visit: Payer: Self-pay | Admitting: Dermatology

## 2024-11-16 DIAGNOSIS — L649 Androgenic alopecia, unspecified: Secondary | ICD-10-CM
